# Patient Record
Sex: Female | Born: 1975 | Hispanic: Refuse to answer | Marital: Single | Smoking: Never smoker
Health system: Southern US, Community
[De-identification: ages and names within clinical notes are randomized; demographics above are authoritative.]

## PROBLEM LIST (undated history)

## (undated) DIAGNOSIS — E119 Type 2 diabetes mellitus without complications: Secondary | ICD-10-CM

## (undated) DIAGNOSIS — E282 Polycystic ovarian syndrome: Secondary | ICD-10-CM

## (undated) HISTORY — PX: APPENDECTOMY: SHX54

## (undated) HISTORY — DX: Polycystic ovarian syndrome: E28.2

## (undated) HISTORY — PX: SALPINGECTOMY: SHX328

## (undated) HISTORY — DX: Type 2 diabetes mellitus without complications: E11.9

## (undated) HISTORY — PX: OOPHORECTOMY: SHX86

---

## 1999-09-19 ENCOUNTER — Other Ambulatory Visit: Admission: RE | Admit: 1999-09-19 | Discharge: 1999-09-19 | Payer: Self-pay | Admitting: Obstetrics and Gynecology

## 2000-11-12 ENCOUNTER — Other Ambulatory Visit: Admission: RE | Admit: 2000-11-12 | Discharge: 2000-11-12 | Payer: Self-pay | Admitting: Obstetrics and Gynecology

## 2012-09-09 HISTORY — PX: TERATOMA EXCISION: SHX2491

## 2014-12-20 ENCOUNTER — Encounter: Payer: Self-pay | Admitting: *Deleted

## 2014-12-20 ENCOUNTER — Ambulatory Visit
Admit: 2014-12-20 | Disposition: A | Discharge status: Home / Self Care | Payer: Self-pay | Attending: Obstetrics and Gynecology | Admitting: Obstetrics and Gynecology

## 2017-10-15 ENCOUNTER — Encounter: Payer: Self-pay | Admitting: Obstetrics and Gynecology

## 2017-10-15 ENCOUNTER — Ambulatory Visit (INDEPENDENT_AMBULATORY_CARE_PROVIDER_SITE_OTHER): Payer: BC Managed Care – PPO | Admitting: Obstetrics and Gynecology

## 2017-10-15 DIAGNOSIS — Z569 Unspecified problems related to employment: Secondary | ICD-10-CM

## 2017-10-15 DIAGNOSIS — Z1231 Encounter for screening mammogram for malignant neoplasm of breast: Secondary | ICD-10-CM

## 2017-10-15 DIAGNOSIS — Z124 Encounter for screening for malignant neoplasm of cervix: Secondary | ICD-10-CM | POA: Diagnosis not present

## 2017-10-15 DIAGNOSIS — Z01419 Encounter for gynecological examination (general) (routine) without abnormal findings: Secondary | ICD-10-CM | POA: Diagnosis not present

## 2017-10-15 DIAGNOSIS — Z1239 Encounter for other screening for malignant neoplasm of breast: Secondary | ICD-10-CM

## 2017-10-15 DIAGNOSIS — Z113 Encounter for screening for infections with a predominantly sexual mode of transmission: Secondary | ICD-10-CM | POA: Diagnosis not present

## 2017-10-15 NOTE — Patient Instructions (Signed)
Preventive Care 18-39 Years, Female Preventive care refers to lifestyle choices and visits with your health care provider that can promote health and wellness. What does preventive care include?  A yearly physical exam. This is also called an annual well check.  Dental exams once or twice a year.  Routine eye exams. Ask your health care provider how often you should have your eyes checked.  Personal lifestyle choices, including: ? Daily care of your teeth and gums. ? Regular physical activity. ? Eating a healthy diet. ? Avoiding tobacco and drug use. ? Limiting alcohol use. ? Practicing safe sex. ? Taking vitamin and mineral supplements as recommended by your health care provider. What happens during an annual well check? The services and screenings done by your health care provider during your annual well check will depend on your age, overall health, lifestyle risk factors, and family history of disease. Counseling Your health care provider may ask you questions about your:  Alcohol use.  Tobacco use.  Drug use.  Emotional well-being.  Home and relationship well-being.  Sexual activity.  Eating habits.  Work and work Statistician.  Method of birth control.  Menstrual cycle.  Pregnancy history.  Screening You may have the following tests or measurements:  Height, weight, and BMI.  Diabetes screening. This is done by checking your blood sugar (glucose) after you have not eaten for a while (fasting).  Blood pressure.  Lipid and cholesterol levels. These may be checked every 5 years starting at age 66.  Skin check.  Hepatitis C blood test.  Hepatitis B blood test.  Sexually transmitted disease (STD) testing.  BRCA-related cancer screening. This may be done if you have a family history of breast, ovarian, tubal, or peritoneal cancers.  Pelvic exam and Pap test. This may be done every 3 years starting at age 40. Starting at age 59, this may be done every 5  years if you have a Pap test in combination with an HPV test.  Discuss your test results, treatment options, and if necessary, the need for more tests with your health care provider. Vaccines Your health care provider may recommend certain vaccines, such as:  Influenza vaccine. This is recommended every year.  Tetanus, diphtheria, and acellular pertussis (Tdap, Td) vaccine. You may need a Td booster every 10 years.  Varicella vaccine. You may need this if you have not been vaccinated.  HPV vaccine. If you are 69 or younger, you may need three doses over 6 months.  Measles, mumps, and rubella (MMR) vaccine. You may need at least one dose of MMR. You may also need a second dose.  Pneumococcal 13-valent conjugate (PCV13) vaccine. You may need this if you have certain conditions and were not previously vaccinated.  Pneumococcal polysaccharide (PPSV23) vaccine. You may need one or two doses if you smoke cigarettes or if you have certain conditions.  Meningococcal vaccine. One dose is recommended if you are age 27-21 years and a first-year college student living in a residence hall, or if you have one of several medical conditions. You may also need additional booster doses.  Hepatitis A vaccine. You may need this if you have certain conditions or if you travel or work in places where you may be exposed to hepatitis A.  Hepatitis B vaccine. You may need this if you have certain conditions or if you travel or work in places where you may be exposed to hepatitis B.  Haemophilus influenzae type b (Hib) vaccine. You may need this if  you have certain risk factors.  Talk to your health care provider about which screenings and vaccines you need and how often you need them. This information is not intended to replace advice given to you by your health care provider. Make sure you discuss any questions you have with your health care provider. Document Released: 10/22/2001 Document Revised: 05/15/2016  Document Reviewed: 06/27/2015 Elsevier Interactive Patient Education  [employer].

## 2017-10-15 NOTE — Progress Notes (Signed)
Patient ID: Tamara Blanchard, female   DOB: 01/21/1976, 42 y.o.   MRN: [id number]    Gynecology Annual Exam  PCP: Patient, No Pcp Per  Chief Complaint:  Chief Complaint  Patient presents with  . Gynecologic Exam    History of Present Illness: Patient is a 42 y.o. G1P1 presents for annual exam. The patient has no complaints today.   LMP: No LMP recorded. Patient is not currently having periods (Reason: IUD).  The patient is sexually active. She currently uses none for contraception. She denies dyspareunia.  The patient does perform self breast exams.  There is no notable family history of breast or ovarian cancer in her family.  The patient wears seatbelts: yes.   The patient has regular exercise: not asked.    The patient denies current symptoms of depression.    Review of Systems: Review of Systems  Constitutional: Negative for chills and fever.  HENT: Negative for congestion.   Respiratory: Negative for cough and shortness of breath.   Cardiovascular: Negative for chest pain and palpitations.  Gastrointestinal: Negative for abdominal pain, constipation, diarrhea, heartburn, nausea and vomiting.  Genitourinary: Negative for dysuria, frequency and urgency.  Skin: Negative for itching and rash.  Neurological: Negative for dizziness and headaches.  Endo/Heme/Allergies: Negative for polydipsia.  Psychiatric/Behavioral: Negative for depression.    Past Medical History:  Past Medical History:  Diagnosis Date  . Diabetes mellitus without complication (HCC)    Type 2  . PCOS (polycystic ovarian syndrome)     Past Surgical History:  Past Surgical History:  Procedure Laterality Date  . APPENDECTOMY    . OOPHORECTOMY     Left  . SALPINGECTOMY     Left   . TERATOMA EXCISION  2014   x3    Gynecologic History:  No LMP recorded. Patient is not currently having periods (Reason: IUD). Contraception: IUD Mirena 08/09/2014 Last Pap: Results were: 06/30/2013 NIL and HR HPV  negative  Last mammogram: 12/21/2014 Results were: BI-RAD II Obstetric History: G1P1  Family History:  Family History  Problem Relation Age of Onset  . Cervical cancer Mother [id number]       Partial hysterectomy    Social History:  Social History   Socioeconomic History  . Marital status: Unknown    Spouse name: Not on file  . Number of children: Not on file  . Years of education: Not on file  . Highest education level: Not on file  Social Needs  . Financial resource strain: Not on file  . Food insecurity - worry: Not on file  . Food insecurity - inability: Not on file  . Transportation needs - medical: Not on file  . Transportation needs - non-medical: Not on file  Occupational History  . Not on file  Tobacco Use  . Smoking status: Never Smoker  . Smokeless tobacco: Never Used  Substance and Sexual Activity  . Alcohol use: Yes  . Drug use: No  . Sexual activity: Not Currently    Birth control/protection: IUD  Other Topics Concern  . Not on file  Social History Narrative  . Not on file    Allergies:  No Known Allergies  Medications: Prior to Admission medications   Medication Sig Start Date End Date Taking? Authorizing Provider  calcium carbonate (CALCIUM 600) 600 MG TABS tablet Take 600 mg by mouth.   Yes [provider]  Cholecalciferol (VITAMIN D3) 1000 units CAPS Take by mouth.   Yes [provider]  metFORMIN (GLUCOPHAGE)  1000 MG tablet TAKE 1 TABLET (1,000 MG TOTAL) BY MOUTH 2 (TWO) TIMES A DAY WITH MEALS. 08/06/17  Yes [provider]  Multiple Vitamin (MULTI-VITAMINS) TABS Take by mouth.   Yes [provider]  TRULICITY 1.5 MG/0.5ML SOPN  10/06/17  Yes [provider]    Physical Exam Vitals: Blood pressure 138/80, pulse 91, height 5' 3.5" (1.613 m), weight 178 lb (80.7 kg).  General: NAD HEENT: normocephalic, anicteric Thyroid: no enlargement, no palpable nodules Pulmonary: No increased work of breathing,  CTAB Cardiovascular: RRR, distal pulses 2+ Breast: Breast symmetrical, no tenderness, no palpable nodules or masses, no skin or nipple retraction present, no nipple discharge.  No axillary or supraclavicular lymphadenopathy. Abdomen: NABS, soft, non-tender, non-distended.  Umbilicus without lesions.  No hepatomegaly, splenomegaly or masses palpable. No evidence of hernia  Genitourinary:  External: Normal external female genitalia.  Normal urethral meatus, normal Bartholin's and Skene's glands.    Vagina: Normal vaginal mucosa, no evidence of prolapse.    Cervix: Grossly normal in appearance, no bleeding, and IUD strings visualized  Uterus: Non-enlarged, mobile, normal contour.  No CMT  Adnexa: ovaries non-enlarged, no adnexal masses  Rectal: deferred  Lymphatic: no evidence of inguinal lymphadenopathy Extremities: no edema, erythema, or tenderness Neurologic: Grossly intact Psychiatric: mood appropriate, affect full  Female chaperone present for pelvic and breast  portions of the physical exam    Assessment: 42 y.o. G1P1 routine annual exam  Plan: Problem List Items Addressed This Visit    None    Visit Diagnoses    Screening for malignant neoplasm of cervix       Relevant Orders   PapIG, HPV, rfx 16/18   Breast screening       Relevant Orders   MM DIGITAL SCREENING BILATERAL   Encounter for gynecological examination without abnormal finding       Relevant Orders   PapIG, HPV, rfx 16/18   HEP, RPR, HIV Panel (Completed)   Hepatitis C antibody (Completed)   Routine screening for STI (sexually transmitted infection)       Relevant Orders   HEP, RPR, HIV Panel (Completed)   Hepatitis C antibody (Completed)   Adverse exposure in workplace       Relevant Orders   HEP, RPR, HIV Panel (Completed)   Hepatitis C antibody (Completed)      1) Mammogram - recommend yearly screening mammogram.  Mammogram Was ordered today   2) STI screening was offered and accepted  3) ASCCP  guidelines and rational discussed.  Patient opts for every 3 years screening interval  4) Contraception - continue IUD 08/09/2014  5) Routine healthcare maintenance including cholesterol, diabetes screening discussed managed by PCP

## 2017-10-16 LAB — HEP, RPR, HIV PANEL
HIV SCREEN 4TH GENERATION: NONREACTIVE
Hepatitis B Surface Ag: NEGATIVE
RPR Ser Ql: NONREACTIVE

## 2017-10-16 LAB — HEPATITIS C ANTIBODY: Hep C Virus Ab: <0.1 s/co ratio (ref 0.0–0.9)

## 2017-10-20 LAB — PAPIG, HPV, RFX 16/18
HPV GENOTYPE, 16: NEGATIVE
HPV Genotype, 18: NEGATIVE
HPV, HIGH-RISK: POSITIVE — AB
PAP Smear Comment: 0

## 2017-10-24 ENCOUNTER — Encounter: Payer: Self-pay | Admitting: Obstetrics and Gynecology

## 2017-10-24 DIAGNOSIS — R87810 Cervical high risk human papillomavirus (HPV) DNA test positive: Secondary | ICD-10-CM | POA: Insufficient documentation

## 2018-05-12 ENCOUNTER — Ambulatory Visit
Admission: RE | Admit: 2018-05-12 | Discharge: 2018-05-12 | Disposition: A | Discharge status: Home / Self Care | Payer: BC Managed Care – PPO | Source: Ambulatory Visit | Attending: Obstetrics and Gynecology | Admitting: Obstetrics and Gynecology

## 2018-05-12 DIAGNOSIS — Z1239 Encounter for other screening for malignant neoplasm of breast: Secondary | ICD-10-CM

## 2018-05-12 DIAGNOSIS — Z1231 Encounter for screening mammogram for malignant neoplasm of breast: Secondary | ICD-10-CM | POA: Diagnosis present

## 2018-05-15 ENCOUNTER — Other Ambulatory Visit: Payer: Self-pay | Admitting: *Deleted

## 2018-05-15 ENCOUNTER — Inpatient Hospital Stay
Admission: RE | Admit: 2018-05-15 | Discharge: 2018-05-15 | Disposition: A | Discharge status: Home / Self Care | Payer: Self-pay | Source: Ambulatory Visit | Attending: *Deleted | Admitting: *Deleted

## 2018-05-15 DIAGNOSIS — Z9289 Personal history of other medical treatment: Secondary | ICD-10-CM

## 2018-10-20 ENCOUNTER — Ambulatory Visit (INDEPENDENT_AMBULATORY_CARE_PROVIDER_SITE_OTHER): Payer: BC Managed Care – PPO | Admitting: Obstetrics and Gynecology

## 2018-10-20 ENCOUNTER — Encounter: Payer: Self-pay | Admitting: Obstetrics and Gynecology

## 2018-10-20 ENCOUNTER — Other Ambulatory Visit (HOSPITAL_COMMUNITY)
Admission: RE | Admit: 2018-10-20 | Discharge: 2018-10-20 | Disposition: A | Discharge status: Home / Self Care | Payer: BC Managed Care – PPO | Source: Ambulatory Visit | Attending: Obstetrics and Gynecology | Admitting: Obstetrics and Gynecology

## 2018-10-20 VITALS — BP 142/92 | HR 97 | Ht 63.5 in | Wt 177.0 lb

## 2018-10-20 DIAGNOSIS — Z124 Encounter for screening for malignant neoplasm of cervix: Secondary | ICD-10-CM

## 2018-10-20 DIAGNOSIS — Z01419 Encounter for gynecological examination (general) (routine) without abnormal findings: Secondary | ICD-10-CM | POA: Diagnosis not present

## 2018-10-20 DIAGNOSIS — Z1239 Encounter for other screening for malignant neoplasm of breast: Secondary | ICD-10-CM

## 2018-10-20 NOTE — Patient Instructions (Signed)
[facility] [address] [place] [address]  [facility] [place]  [address] [place] [address]  Phone: [phone]  

## 2018-10-20 NOTE — Progress Notes (Signed)
Gynecology Annual Exam  PCP: [facility], [place]  Chief Complaint:  Chief Complaint  Patient presents with  . Gynecologic Exam    History of Present Illness: Patient is a 43 y.o. G1P1 presents for annual exam. The patient has no complaints today.   LMP: No LMP recorded. (Menstrual status: IUD). Absent on IUD   The patient is sexually active. She currently uses IUD for contraception. She denies dyspareunia.  There is no notable family history of breast or ovarian cancer in her family.  The patient wears seatbelts: yes.   The patient has regular exercise: not asked.    The patient denies current symptoms of depression.    Review of Systems: Review of Systems  Constitutional: Negative for chills and fever.  HENT: Negative for congestion.   Respiratory: Negative for cough and shortness of breath.   Cardiovascular: Negative for chest pain and palpitations.  Gastrointestinal: Negative for abdominal pain, constipation, diarrhea, heartburn, nausea and vomiting.  Genitourinary: Negative for dysuria, frequency and urgency.  Skin: Negative for itching and rash.  Neurological: Negative for dizziness and headaches.  Endo/Heme/Allergies: Negative for polydipsia.  Psychiatric/Behavioral: Negative for depression.    Past Medical History:  Past Medical History:  Diagnosis Date  . Diabetes mellitus without complication (HCC)    Type 2  . PCOS (polycystic ovarian syndrome)     Past Surgical History:  Past Surgical History:  Procedure Laterality Date  . APPENDECTOMY    . OOPHORECTOMY     Left  . SALPINGECTOMY     Left   . TERATOMA EXCISION  2014   x3    Gynecologic History:  No LMP recorded. (Menstrual status: IUD). Contraception:08/10/2014 Mirena IUD Last Pap: Results were: 10/15/2017 NIL and HR HPV+  Last mammogram: 05/13/19-19 Results were: BI-RAD I  Obstetric History: G1P1  Family History:  Family History  Problem Relation Age of Onset  . Cervical cancer  Mother 53       Partial hysterectomy    Social History:  Social History   Socioeconomic History  . Marital status: Single    Spouse name: Not on file  . Number of children: Not on file  . Years of education: Not on file  . Highest education level: Not on file  Occupational History  . Not on file  Social Needs  . Financial resource strain: Not on file  . Food insecurity:    Worry: Not on file    Inability: Not on file  . Transportation needs:    Medical: Not on file    Non-medical: Not on file  Tobacco Use  . Smoking status: Never Smoker  . Smokeless tobacco: Never Used  Substance and Sexual Activity  . Alcohol use: Yes  . Drug use: No  . Sexual activity: Not Currently    Birth control/protection: I.U.D.  Lifestyle  . Physical activity:    Days per week: 0 days    Minutes per session: 0 min  . Stress: Not at all  Relationships  . Social connections:    Talks on phone: More than three times a week    Gets together: Never    Attends religious service: 1 to 4 times per year    Active member of club or organization: Yes    Attends meetings of clubs or organizations: Never    Relationship status: Never married  . Intimate partner violence:    Fear of current or ex partner: No    Emotionally abused: No  Physically abused: No    Forced sexual activity: No  Other Topics Concern  . Not on file  Social History Narrative  . Not on file    Allergies:  No Known Allergies  Medications: Prior to Admission medications   Medication Sig Start Date End Date Taking? Authorizing Provider  calcium carbonate (CALCIUM 600) 600 MG TABS tablet Take 600 mg by mouth.   Yes [provider]  Cholecalciferol (VITAMIN D3) 1000 units CAPS Take by mouth.   Yes [provider]  glucose blood (PRECISION QID TEST) test strip Twice daily, ICD9 250.0 07/04/14  Yes [provider]  metFORMIN (GLUCOPHAGE) 1000 MG tablet TAKE 1 TABLET (1,000 MG TOTAL) BY MOUTH 2  (TWO) TIMES A DAY WITH MEALS. 08/06/17  Yes [provider]  Multiple Vitamin (MULTI-VITAMINS) TABS Take by mouth.   Yes [provider]  TRULICITY 1.5 MG/0.5ML SOPN  10/06/17  Yes [provider]    Physical Exam Vitals: Blood pressure (!) 142/92, pulse 97, height 5' 3.5" (1.613 m), weight 177 lb (80.3 kg).  General: NAD HEENT: normocephalic, anicteric Thyroid: no enlargement, no palpable nodules Pulmonary: No increased work of breathing, CTAB Cardiovascular: RRR, distal pulses 2+ Breast: Breast symmetrical, no tenderness, no palpable nodules or masses, no skin or nipple retraction present, no nipple discharge.  No axillary or supraclavicular lymphadenopathy. Abdomen: NABS, soft, non-tender, non-distended.  Umbilicus without lesions.  No hepatomegaly, splenomegaly or masses palpable. No evidence of hernia  Genitourinary:  External: Normal external female genitalia.  Normal urethral meatus, normal Bartholin's and Skene's glands.    Vagina: Normal vaginal mucosa, no evidence of prolapse.    Cervix: Grossly normal in appearance, no bleeding, IUD strings visualized 2cm  Uterus: Non-enlarged, mobile, normal contour.  No CMT  Adnexa: ovaries non-enlarged, no adnexal masses  Rectal: deferred  Lymphatic: no evidence of inguinal lymphadenopathy Extremities: no edema, erythema, or tenderness Neurologic: Grossly intact Psychiatric: mood appropriate, affect full  Female chaperone present for pelvic and breast  portions of the physical exam    Assessment: 43 y.o. G1P1 routine annual exam  Plan: Problem List Items Addressed This Visit    None    Visit Diagnoses    Encounter for gynecological examination without abnormal finding    -  Primary   Screening for malignant neoplasm of cervix       Relevant Orders   Cytology, thin prep pap (cervical)   Breast screening       Relevant Orders   MM 3D SCREEN BREAST BILATERAL      1) Mammogram - recommend yearly  screening mammogram.  Mammogram Is up to date   2) STI screening  was notoffered and therefore not obtained  3) ASCCP guidelines and rational discussed.  Patient opts for yearly screening interval  4) Contraception - the patient is currently using  IUD.  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   Vena Austria, MD, [facility], [facility] 10/20/2018, 9:10 AM

## 2018-10-21 LAB — CYTOLOGY - PAP
Diagnosis: NEGATIVE
HPV: NOT DETECTED

## 2019-09-23 ENCOUNTER — Ambulatory Visit
Admission: RE | Admit: 2019-09-23 | Discharge: 2019-09-23 | Disposition: A | Discharge status: Home / Self Care | Payer: BC Managed Care – PPO | Source: Ambulatory Visit | Attending: Obstetrics and Gynecology | Admitting: Obstetrics and Gynecology

## 2019-09-23 DIAGNOSIS — Z1239 Encounter for other screening for malignant neoplasm of breast: Secondary | ICD-10-CM

## 2019-09-23 DIAGNOSIS — Z1231 Encounter for screening mammogram for malignant neoplasm of breast: Secondary | ICD-10-CM | POA: Insufficient documentation

## 2019-10-25 ENCOUNTER — Ambulatory Visit: Payer: BC Managed Care – PPO | Admitting: Obstetrics and Gynecology

## 2019-10-29 ENCOUNTER — Ambulatory Visit (INDEPENDENT_AMBULATORY_CARE_PROVIDER_SITE_OTHER): Payer: BC Managed Care – PPO | Admitting: Obstetrics and Gynecology

## 2019-10-29 ENCOUNTER — Encounter: Payer: Self-pay | Admitting: Obstetrics and Gynecology

## 2019-10-29 ENCOUNTER — Other Ambulatory Visit: Payer: Self-pay

## 2019-10-29 VITALS — BP 126/84 | HR 89 | Ht 63.5 in | Wt 174.0 lb

## 2019-10-29 DIAGNOSIS — Z1239 Encounter for other screening for malignant neoplasm of breast: Secondary | ICD-10-CM

## 2019-10-29 DIAGNOSIS — Z01419 Encounter for gynecological examination (general) (routine) without abnormal findings: Secondary | ICD-10-CM

## 2019-10-29 DIAGNOSIS — Z30431 Encounter for routine checking of intrauterine contraceptive device: Secondary | ICD-10-CM

## 2019-10-29 NOTE — Progress Notes (Signed)
Gynecology Annual Exam  PCP: [facility], [place]  Chief Complaint:  Chief Complaint  Patient presents with  . Gynecologic Exam    History of Present Illness: Patient is a 44 y.o. G1P1 presents for annual exam. The patient has no complaints today.   LMP: No LMP recorded. (Menstrual status: IUD). Amenorrhea on IUD  The patient is sexually active. She currently uses IUD for contraception. She denies dyspareunia.  The patient does perform self breast exams.  There is no notable family history of breast or ovarian cancer in her family.  The patient wears seatbelts: yes.   The patient has regular exercise: not asked.    The patient denies current symptoms of depression.    Review of Systems: Review of Systems  Constitutional: Negative for chills and fever.  HENT: Negative for congestion.   Respiratory: Negative for cough and shortness of breath.   Cardiovascular: Negative for chest pain and palpitations.  Gastrointestinal: Negative for abdominal pain, constipation, diarrhea, heartburn, nausea and vomiting.  Genitourinary: Negative for dysuria, frequency and urgency.  Skin: Negative for itching and rash.  Neurological: Negative for dizziness and headaches.  Endo/Heme/Allergies: Negative for polydipsia.  Psychiatric/Behavioral: Negative for depression.    Past Medical History:  Past Medical History:  Diagnosis Date  . Diabetes mellitus without complication (HCC)    Type 2  . PCOS (polycystic ovarian syndrome)     Past Surgical History:  Past Surgical History:  Procedure Laterality Date  . APPENDECTOMY    . OOPHORECTOMY     Left  . SALPINGECTOMY     Left   . TERATOMA EXCISION  2014   x3    Gynecologic History:  No LMP recorded. (Menstrual status: IUD). Contraception:08/10/2014 Mirena IUD Last Pap: Results were: 10/20/2018 NIL and HR HPV negative  Last mammogram: 09/23/19 Results were: BI-RAD I  Obstetric History: G1P1  Family History:  Family History    Problem Relation Age of Onset  . Cervical cancer Mother 51       Partial hysterectomy    Social History:  Social History   Socioeconomic History  . Marital status: Single    Spouse name: Not on file  . Number of children: Not on file  . Years of education: Not on file  . Highest education level: Not on file  Occupational History  . Not on file  Tobacco Use  . Smoking status: Never Smoker  . Smokeless tobacco: Never Used  Substance and Sexual Activity  . Alcohol use: Yes  . Drug use: No  . Sexual activity: Not Currently    Birth control/protection: I.U.D.  Other Topics Concern  . Not on file  Social History Narrative  . Not on file   Social Determinants of Health   Financial Resource Strain:   . Difficulty of Paying Living Expenses: Not on file  Food Insecurity:   . Worried About Charity fundraiser in the Last Year: Not on file  . Ran Out of Food in the Last Year: Not on file  Transportation Needs:   . Lack of Transportation (Medical): Not on file  . Lack of Transportation (Non-Medical): Not on file  Physical Activity:   . Days of Exercise per Week: Not on file  . Minutes of Exercise per Session: Not on file  Stress:   . Feeling of Stress : Not on file  Social Connections:   . Frequency of Communication with Friends and Family: Not on file  . Frequency of Social Gatherings  with Friends and Family: Not on file  . Attends Religious Services: Not on file  . Active Member of Clubs or Organizations: Not on file  . Attends Banker Meetings: Not on file  . Marital Status: Not on file  Intimate Partner Violence:   . Fear of Current or Ex-Partner: Not on file  . Emotionally Abused: Not on file  . Physically Abused: Not on file  . Sexually Abused: Not on file    Allergies:  No Known Allergies  Medications: Prior to Admission medications   Medication Sig Start Date End Date Taking? Authorizing Provider  calcium carbonate (CALCIUM 600) 600 MG TABS  tablet Take 600 mg by mouth.   Yes [provider]  Cholecalciferol (VITAMIN D3) 1000 units CAPS Take by mouth.   Yes [provider]  metFORMIN (GLUCOPHAGE) 1000 MG tablet TAKE 1 TABLET (1,000 MG TOTAL) BY MOUTH 2 (TWO) TIMES A DAY WITH MEALS. 08/06/17  Yes [provider]  Multiple Vitamin (MULTI-VITAMINS) TABS Take by mouth.   Yes [provider]  TRULICITY 1.5 MG/0.5ML SOPN  10/06/17  Yes [provider]  glucose blood (PRECISION QID TEST) test strip Twice daily, ICD9 250.0 07/04/14   [provider]    Physical Exam Vitals: Blood pressure 126/84, pulse 89, height 5' 3.5" (1.613 m), weight 174 lb (78.9 kg).  General: NAD HEENT: normocephalic, anicteric Thyroid: no enlargement, no palpable nodules Pulmonary: No increased work of breathing, CTAB Cardiovascular: RRR, distal pulses 2+ Breast: Breast symmetrical, no tenderness, no palpable nodules or masses, no skin or nipple retraction present, no nipple discharge.  No axillary or supraclavicular lymphadenopathy. Abdomen: NABS, soft, non-tender, non-distended.  Umbilicus without lesions.  No hepatomegaly, splenomegaly or masses palpable. No evidence of hernia  Genitourinary:  External: Normal external female genitalia.  Normal urethral meatus, normal Bartholin's and Skene's glands.    Vagina: Normal vaginal mucosa, no evidence of prolapse.    Cervix: Grossly normal in appearance, no bleeding, IUD strings visualized  Uterus: Non-enlarged, mobile, normal contour.  No CMT  Adnexa: ovaries non-enlarged, no adnexal masses  Rectal: deferred  Lymphatic: no evidence of inguinal lymphadenopathy Extremities: no edema, erythema, or tenderness Neurologic: Grossly intact Psychiatric: mood appropriate, affect full  Female chaperone present for pelvic and breast  portions of the physical exam     Assessment: 44 y.o. G1P1 routine annual exam  Plan: Problem List Items Addressed This Visit     None      1) Mammogram - recommend yearly screening mammogram.  Mammogram Is up to date   2) STI screening  was notoffered and therefore not obtained  3) ASCCP guidelines and rational discussed.  Patient opts for every 3 years screening interval  4) Contraception - the patient is currently using  IUD.  She is happy with her current form of contraception and plans to continue  5) Colonoscopy -- Screening recommended starting at age 35 for average risk individuals, age 62 for individuals deemed at increased risk (including African Americans) and recommended to continue until age 16.  For patient age 55-85 individualized approach is recommended.  Gold standard screening is via colonoscopy, Cologuard screening is an acceptable alternative for patient unwilling or unable to undergo colonoscopy.  "Colorectal cancer screening for average?risk adults: 2018 guideline update from the American Cancer Society"CA: A Cancer Journal for Clinicians: Feb 05, 2017   6) Routine healthcare maintenance including cholesterol, diabetes screening discussed managed by PCP  7) Return in about 1 year (around 10/28/2020)  for annual.   Vena Austria, MD, [facility], [facility] 10/29/2019, 9:28 AM

## 2020-09-28 ENCOUNTER — Other Ambulatory Visit: Payer: Self-pay | Admitting: Obstetrics and Gynecology

## 2020-09-28 DIAGNOSIS — Z1231 Encounter for screening mammogram for malignant neoplasm of breast: Secondary | ICD-10-CM

## 2020-10-26 ENCOUNTER — Ambulatory Visit
Admission: RE | Admit: 2020-10-26 | Discharge: 2020-10-26 | Disposition: A | Discharge status: Home / Self Care | Payer: BC Managed Care – PPO | Source: Ambulatory Visit | Attending: Obstetrics and Gynecology | Admitting: Obstetrics and Gynecology

## 2020-10-26 ENCOUNTER — Other Ambulatory Visit: Payer: Self-pay

## 2020-10-26 DIAGNOSIS — Z1231 Encounter for screening mammogram for malignant neoplasm of breast: Secondary | ICD-10-CM | POA: Insufficient documentation

## 2020-10-27 ENCOUNTER — Other Ambulatory Visit: Payer: BC Managed Care – PPO

## 2020-10-27 DIAGNOSIS — Z20822 Contact with and (suspected) exposure to covid-19: Secondary | ICD-10-CM

## 2020-10-28 LAB — NOVEL CORONAVIRUS, NAA: SARS-CoV-2, NAA: NOT DETECTED

## 2020-10-28 LAB — SARS-COV-2, NAA 2 DAY TAT

## 2020-10-30 ENCOUNTER — Other Ambulatory Visit: Payer: Self-pay

## 2020-10-30 ENCOUNTER — Ambulatory Visit (INDEPENDENT_AMBULATORY_CARE_PROVIDER_SITE_OTHER): Payer: BC Managed Care – PPO | Admitting: Obstetrics and Gynecology

## 2020-10-30 ENCOUNTER — Encounter: Payer: Self-pay | Admitting: Obstetrics and Gynecology

## 2020-10-30 VITALS — BP 126/82 | Ht 63.5 in | Wt 171.2 lb

## 2020-10-30 DIAGNOSIS — Z01419 Encounter for gynecological examination (general) (routine) without abnormal findings: Secondary | ICD-10-CM | POA: Diagnosis not present

## 2020-10-30 DIAGNOSIS — Z1239 Encounter for other screening for malignant neoplasm of breast: Secondary | ICD-10-CM

## 2020-10-30 NOTE — Progress Notes (Signed)
Gynecology Annual Exam  PCP: [facility], [place]  Chief Complaint:  Chief Complaint  Patient presents with  . Gynecologic Exam    Annual - no concerns. RM 4    History of Present Illness: Patient is a 45 y.o. G1P1 presents for annual exam. The patient has no complaints today.   LMP: No LMP recorded. (Menstrual status: IUD). Amenorrhea secondary to Mirena IUD  The patient is not currently sexually active. She currently uses IUD for contraception. The patient does perform self breast exams.  There is no notable family history of breast or ovarian cancer in her family.  The patient wears seatbelts: yes.   The patient has regular exercise: not asked.    The patient denies current symptoms of depression.    Review of Systems: Review of Systems  Constitutional: Negative for chills and fever.  HENT: Negative for congestion.   Respiratory: Negative for cough and shortness of breath.   Cardiovascular: Negative for chest pain and palpitations.  Gastrointestinal: Negative for abdominal pain, constipation, diarrhea, heartburn, nausea and vomiting.  Genitourinary: Negative for dysuria, frequency and urgency.  Skin: Negative for itching and rash.  Neurological: Negative for dizziness and headaches.  Endo/Heme/Allergies: Negative for polydipsia.  Psychiatric/Behavioral: Negative for depression.    Past Medical History:  Patient Active Problem List   Diagnosis Date Noted  . Pap smear of cervix shows high risk HPV present 10/24/2017    10/15/2017 NILM and HPV 46/18 negative - repeat pap with co-testing in 1 year     Past Surgical History:  Past Surgical History:  Procedure Laterality Date  . APPENDECTOMY    . OOPHORECTOMY     Left  . SALPINGECTOMY     Left   . TERATOMA EXCISION  2014   x3    Gynecologic History:  No LMP recorded. (Menstrual status: IUD). Contraception:08/10/2014 Mirena IUD Last Pap: Results were: 10/20/2018  NIL and HR HPV negative  Last mammogram:  10/26/2020 Results pending  Obstetric History: G1P1  Family History:  Family History  Problem Relation Age of Onset  . Cervical cancer Mother 46       Partial hysterectomy  . Hypertension Mother   . Diabetes Father   . Hyperlipidemia Father   . Stroke Father   . Heart failure Father   . Cancer Maternal Grandmother   . Cancer Maternal Grandfather     Social History:  Social History   Socioeconomic History  . Marital status: Single    Spouse name: Not on file  . Number of children: Not on file  . Years of education: Not on file  . Highest education level: Not on file  Occupational History  . Not on file  Tobacco Use  . Smoking status: Never Smoker  . Smokeless tobacco: Never Used  Vaping Use  . Vaping Use: Never used  Substance and Sexual Activity  . Alcohol use: Yes  . Drug use: No  . Sexual activity: Not Currently    Birth control/protection: I.U.D.  Other Topics Concern  . Not on file  Social History Narrative  . Not on file   Social Determinants of Health   Financial Resource Strain: Not on file  Food Insecurity: Not on file  Transportation Needs: Not on file  Physical Activity: Not on file  Stress: Not on file  Social Connections: Not on file  Intimate Partner Violence: Not on file    Allergies:  No Known Allergies  Medications: Prior to Admission medications  Medication Sig Start Date End Date Taking? Authorizing Provider  atorvastatin (LIPITOR) 20 MG tablet Take 20 mg by mouth daily. 10/21/20  Yes [provider]  calcium carbonate (OS-CAL) 600 MG TABS tablet Take 600 mg by mouth.   Yes [provider]  Cholecalciferol (VITAMIN D3) 1000 units CAPS Take by mouth.   Yes [provider]  Dulaglutide (TRULICITY) 3 MG/0.5ML SOPN Inject into the skin. 10/19/20  Yes [provider]  metFORMIN (GLUCOPHAGE) 1000 MG tablet TAKE 1 TABLET (1,000 MG TOTAL) BY MOUTH 2 (TWO) TIMES A DAY WITH MEALS. 08/06/17  Yes [provider]  Multiple Vitamin (MULTI-VITAMINS) TABS Take by mouth.   Yes [provider]  glucose blood (PRECISION QID TEST) test strip Twice daily, ICD9 250.0 07/04/14   [provider]    Physical Exam Vitals: Blood pressure 126/82, height 5' 3.5" (1.613 m), weight 171 lb 4 oz (77.7 kg).  General: NAD HEENT: normocephalic, anicteric Thyroid: no enlargement, no palpable nodules Pulmonary: No increased work of breathing, CTAB Cardiovascular: RRR, distal pulses 2+ Breast: Breast symmetrical, no tenderness, no palpable nodules or masses, no skin or nipple retraction present, no nipple discharge.  No axillary or supraclavicular lymphadenopathy. Abdomen: NABS, soft, non-tender, non-distended.  Umbilicus without lesions.  No hepatomegaly, splenomegaly or masses palpable. No evidence of hernia  Genitourinary:  External: Normal external female genitalia.  Normal urethral meatus, normal Bartholin's and Skene's glands.    Vagina: Normal vaginal mucosa, no evidence of prolapse.    Cervix: Grossly normal in appearance, no bleeding, IUD stings visualized  Uterus: Non-enlarged, mobile, normal contour.  No CMT  Adnexa: ovaries non-enlarged, no adnexal masses  Rectal: deferred  Lymphatic: no evidence of inguinal lymphadenopathy Extremities: no edema, erythema, or tenderness Neurologic: Grossly intact Psychiatric: mood appropriate, affect full  Female chaperone present for pelvic and breast  portions of the physical exam    Assessment: 45 y.o. G1P1 routine annual exam  Plan: Problem List Items Addressed This Visit   None   Visit Diagnoses    Encounter for gynecological examination without abnormal finding    -  Primary   Breast screening          1) Mammogram - recommend yearly screening mammogram.  Mammogram Is up to date - results pending  2) STI screening  was notoffered and therefore not obtained  3) ASCCP guidelines and rational discussed.  Patient opts for  every 3 years screening interval  4) Contraception - the patient is currently using  IUD.  She is happy with her current form of contraception and plans to continue  5) Colonoscopy -- Screening recommended starting at age 46 for average risk individuals, age 34 for individuals deemed at increased risk (including African Americans) and recommended to continue until age 79.  For patient age 15-85 individualized approach is recommended.  Gold standard screening is via colonoscopy, Cologuard screening is an acceptable alternative for patient unwilling or unable to undergo colonoscopy.  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   Vena Austria, MD, [facility], [facility] 10/30/2020, 8:36 AM

## 2021-09-19 ENCOUNTER — Other Ambulatory Visit: Payer: Self-pay | Admitting: Obstetrics and Gynecology

## 2021-09-19 ENCOUNTER — Telehealth: Payer: Self-pay | Admitting: Advanced Practice Midwife

## 2021-09-19 DIAGNOSIS — Z1231 Encounter for screening mammogram for malignant neoplasm of breast: Secondary | ICD-10-CM

## 2021-09-19 NOTE — Telephone Encounter (Signed)
Patient coming in on 11/01/21 at 8:35 with [facility] for mirena removal/reinsertion

## 2021-09-21 NOTE — Telephone Encounter (Signed)
Noted. Will order to arrive by apt date/time. 

## 2021-10-29 ENCOUNTER — Other Ambulatory Visit: Payer: Self-pay

## 2021-10-29 ENCOUNTER — Ambulatory Visit
Admission: RE | Admit: 2021-10-29 | Discharge: 2021-10-29 | Disposition: A | Discharge status: Home / Self Care | Payer: BC Managed Care – PPO | Source: Ambulatory Visit | Attending: Obstetrics and Gynecology | Admitting: Obstetrics and Gynecology

## 2021-10-29 DIAGNOSIS — Z1231 Encounter for screening mammogram for malignant neoplasm of breast: Secondary | ICD-10-CM | POA: Diagnosis present

## 2021-11-01 ENCOUNTER — Ambulatory Visit: Payer: BC Managed Care – PPO | Admitting: Advanced Practice Midwife

## 2021-11-13 ENCOUNTER — Other Ambulatory Visit: Payer: Self-pay | Admitting: Obstetrics and Gynecology

## 2021-11-13 DIAGNOSIS — R928 Other abnormal and inconclusive findings on diagnostic imaging of breast: Secondary | ICD-10-CM

## 2021-11-13 NOTE — Progress Notes (Signed)
Dx mammo order for addl views. AMS pt but he needs someone from [facility] to place order. I notified Jamie at [facility] that order placed for pt.  ?

## 2021-12-03 ENCOUNTER — Other Ambulatory Visit: Payer: Self-pay

## 2021-12-03 ENCOUNTER — Encounter: Payer: Self-pay | Admitting: Obstetrics and Gynecology

## 2021-12-03 ENCOUNTER — Ambulatory Visit
Admission: RE | Admit: 2021-12-03 | Discharge: 2021-12-03 | Disposition: A | Discharge status: Home / Self Care | Payer: BC Managed Care – PPO | Source: Ambulatory Visit | Attending: Obstetrics and Gynecology | Admitting: Obstetrics and Gynecology

## 2021-12-03 DIAGNOSIS — R928 Other abnormal and inconclusive findings on diagnostic imaging of breast: Secondary | ICD-10-CM | POA: Insufficient documentation

## 2021-12-03 NOTE — Addendum Note (Signed)
Addended by: Althea Grimmer B on: 12/03/2021 01:54 PM ? ? Modules accepted: Orders ? ?

## 2022-05-21 ENCOUNTER — Ambulatory Visit
Admission: RE | Admit: 2022-05-21 | Discharge: 2022-05-21 | Disposition: A | Discharge status: Home / Self Care | Payer: BC Managed Care – PPO | Source: Ambulatory Visit | Attending: Obstetrics and Gynecology | Admitting: Obstetrics and Gynecology

## 2022-05-21 ENCOUNTER — Encounter: Payer: Self-pay | Admitting: Obstetrics and Gynecology

## 2022-05-21 DIAGNOSIS — R928 Other abnormal and inconclusive findings on diagnostic imaging of breast: Secondary | ICD-10-CM | POA: Insufficient documentation

## 2022-10-10 ENCOUNTER — Encounter: Payer: Self-pay | Admitting: Obstetrics and Gynecology

## 2022-10-18 ENCOUNTER — Other Ambulatory Visit: Payer: Self-pay | Admitting: Obstetrics and Gynecology

## 2022-10-18 DIAGNOSIS — R921 Mammographic calcification found on diagnostic imaging of breast: Secondary | ICD-10-CM

## 2022-10-31 ENCOUNTER — Ambulatory Visit
Admission: RE | Admit: 2022-10-31 | Discharge: 2022-10-31 | Disposition: A | Discharge status: Home / Self Care | Payer: BC Managed Care – PPO | Source: Ambulatory Visit | Attending: Obstetrics and Gynecology | Admitting: Obstetrics and Gynecology

## 2022-10-31 DIAGNOSIS — R921 Mammographic calcification found on diagnostic imaging of breast: Secondary | ICD-10-CM

## 2023-07-11 IMAGING — MG MM DIGITAL DIAGNOSTIC UNILAT*L* W/ TOMO W/ CAD
8 of 10 series · 8 of 22 positions shown · non-contrast
Comparison: Previous exam(s).

CLINICAL DATA: Callback for LEFT breast calcifications.

EXAM:
DIGITAL DIAGNOSTIC UNILATERAL LEFT MAMMOGRAM WITH TOMOSYNTHESIS AND
CAD
TECHNIQUE: Left digital diagnostic mammography and breast tomosynthesis was
performed. The images were evaluated with computer-aided detection.

[L ML (1 of 3)]
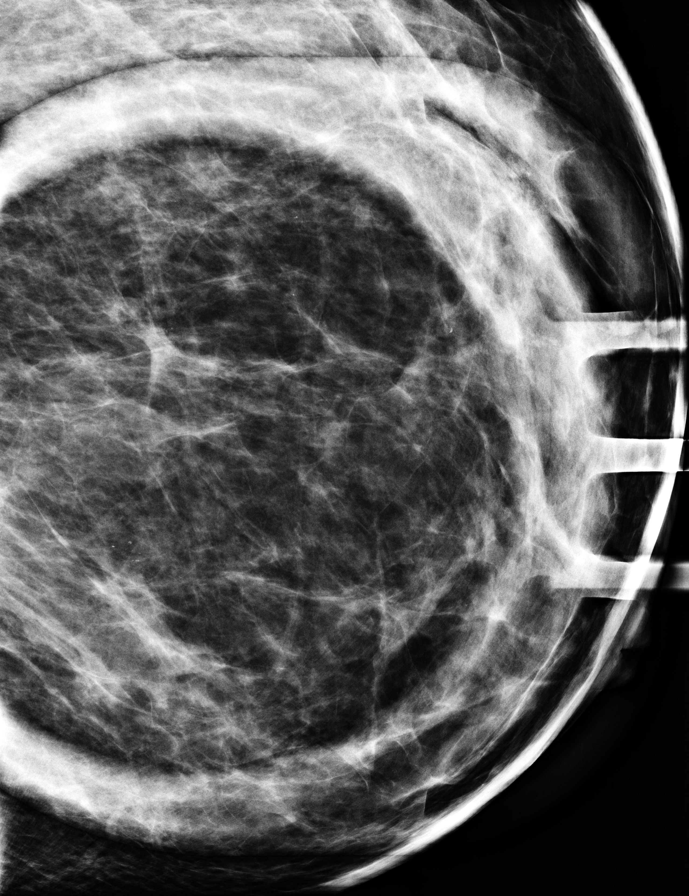

[L CC]
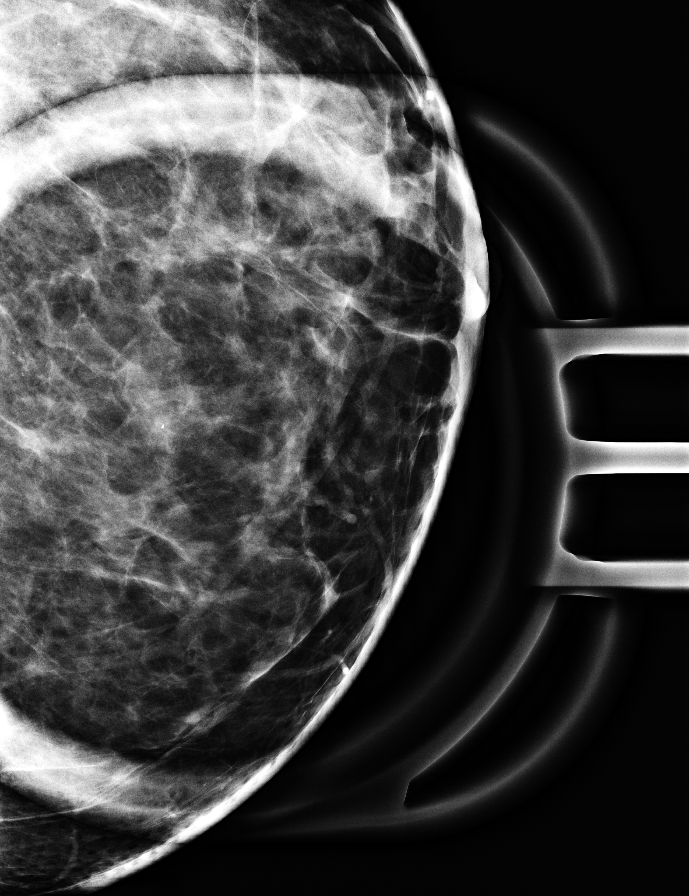

[L ML (2 of 3)]
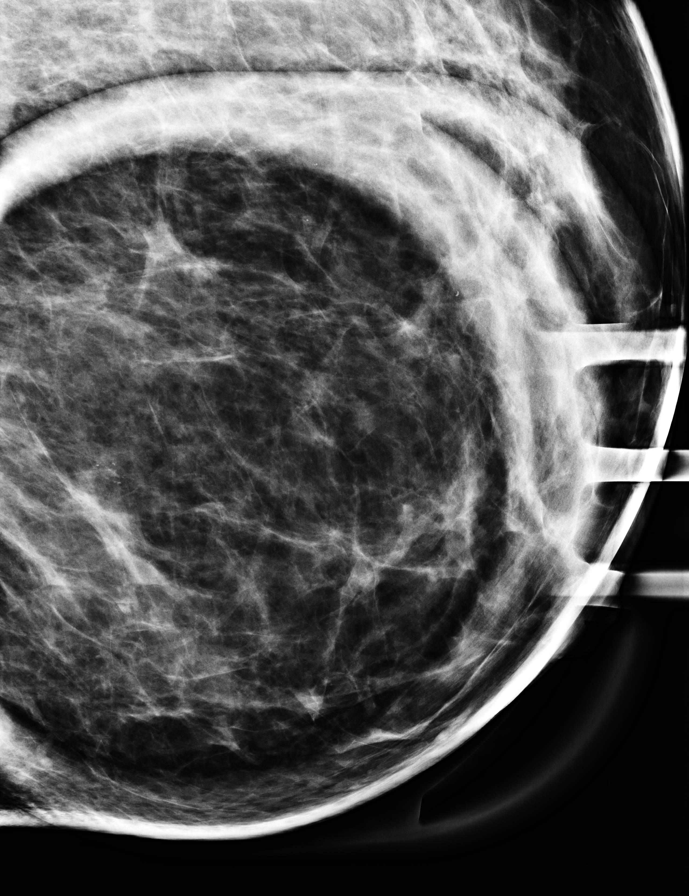

[L ML (3 of 3)]
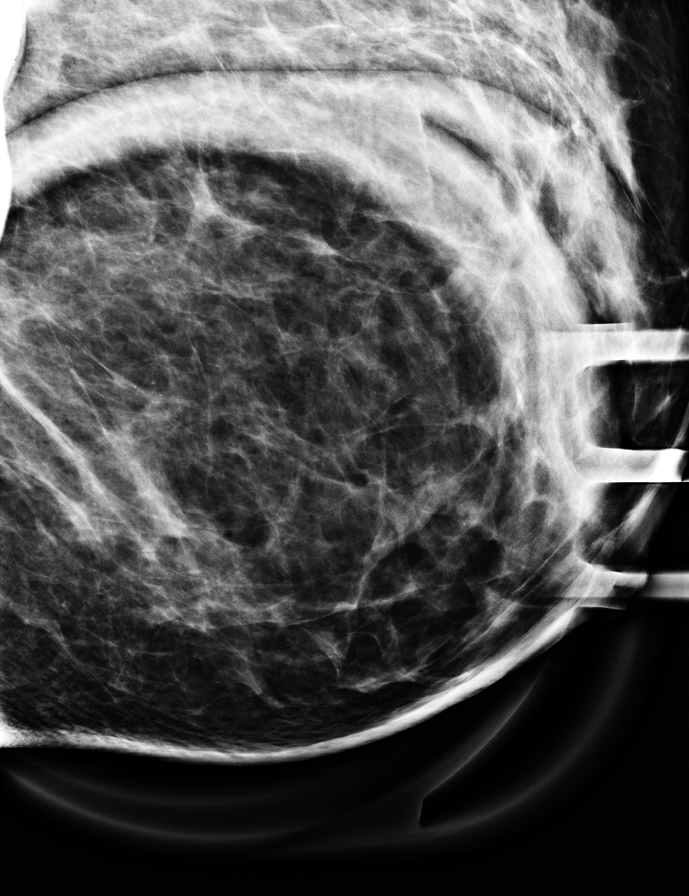

[L ML synth-2D]
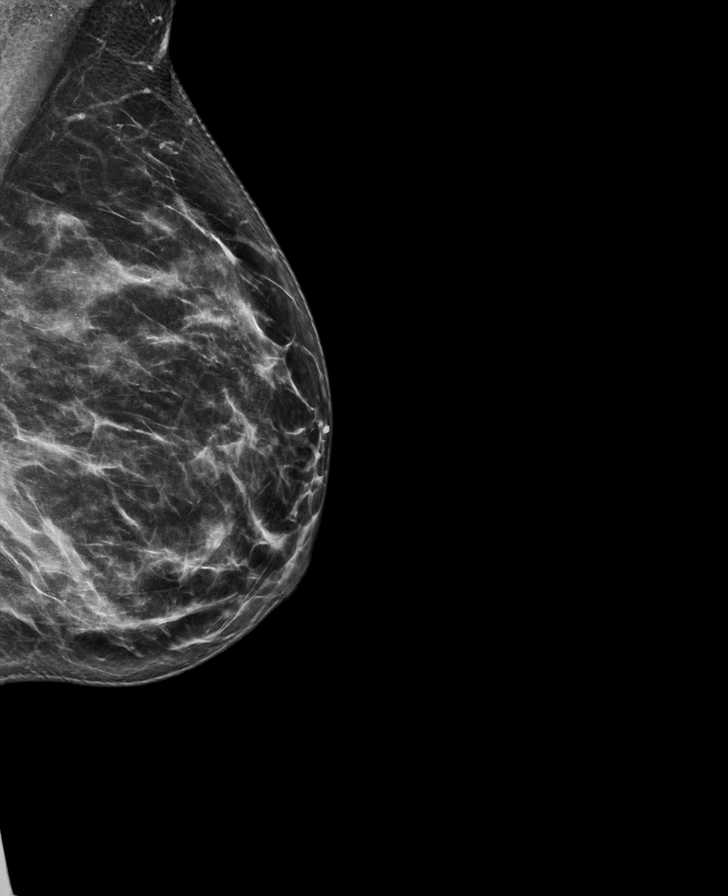

[L CC synth-2D]
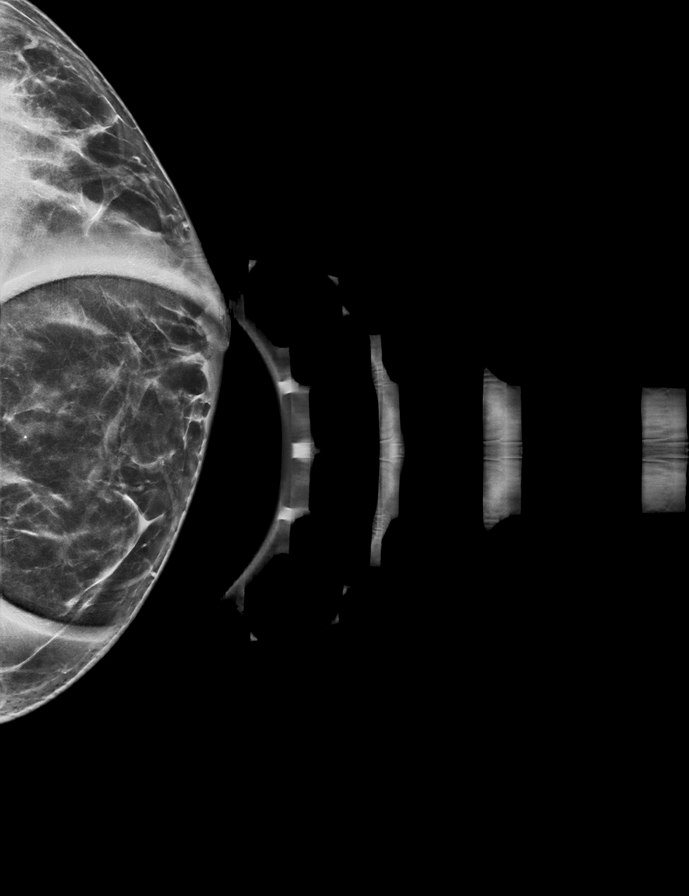

[L MLO synth-2D]
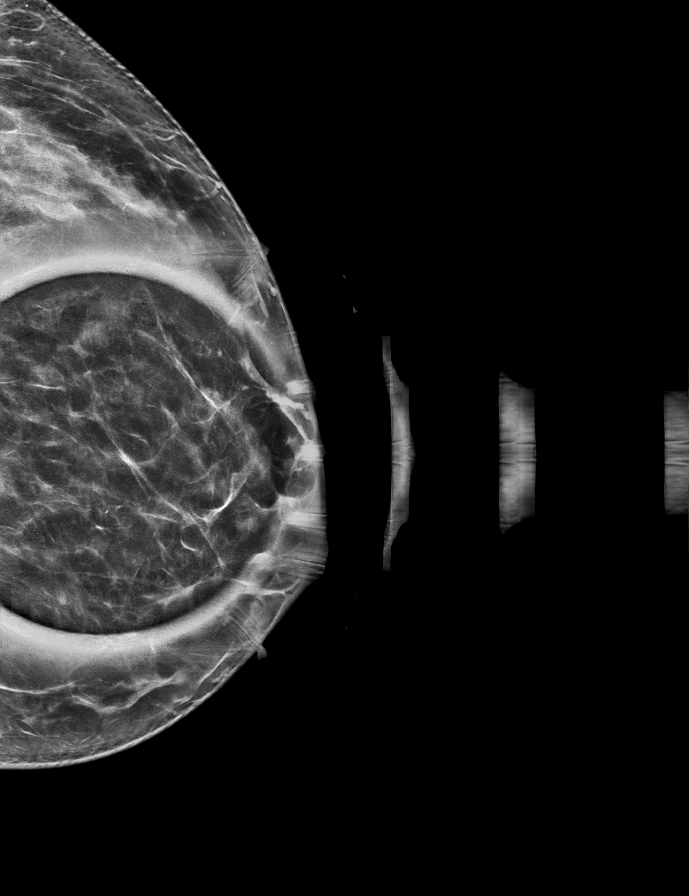

[L MLO tomo · tomo slice 39/77.0]
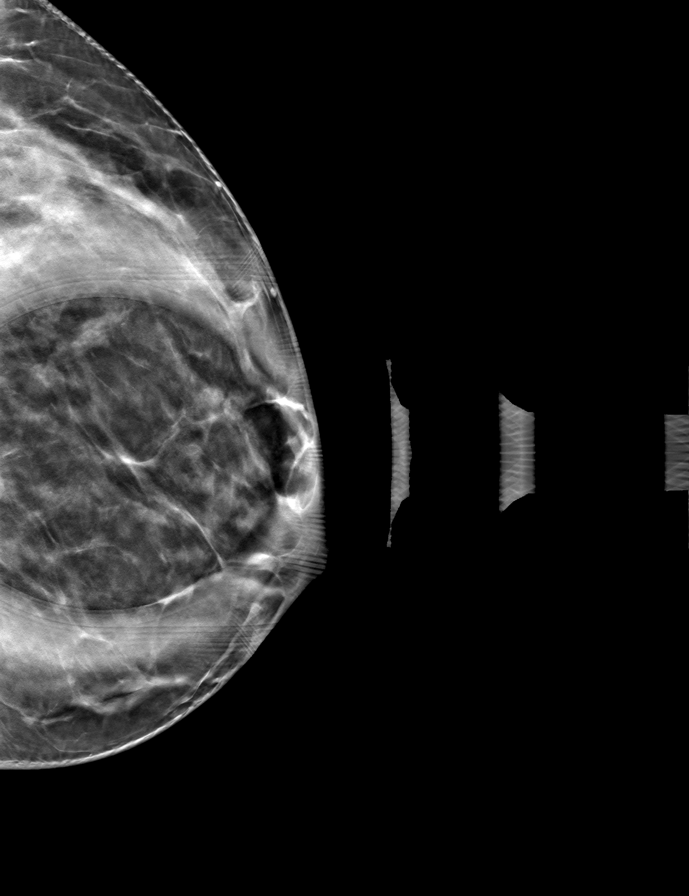

[8 of 22 positions shown; findings below may reference images not displayed]

ACR Breast Density Category b: There are scattered areas of
fibroglandular density.
FINDINGS: Spot magnification views of the LEFT breast demonstrate a 5 mm group
of calcifications in the LEFT lower slightly inner breast at middle
to posterior depth. The majority of these calcifications demonstrate
layering morphology on true lateral imaging spot magnification view
3. However, several of these calcifications do not definitively
demonstrate layering morphology.

Spot magnification views of the LEFT breast demonstrate a single
benign dystrophic calcification in the LEFT upper outer breast at
anterior depth at the second site of screening mammographic concern.

No additional suspicious findings noted.
IMPRESSION: There is a probably benign 5 mm group of calcifications in the LEFT
lower inner breast at middle to posterior depth. These likely
reflect benign milk of calcium given the majority of calcifications
demonstrate layering morphology. As such, recommend follow-up
diagnostic mammogram in 6 months. This will establish 6 months of
definitive stability.

RECOMMENDATION:
LEFT diagnostic mammogram in 6 months.

I have discussed the findings and recommendations with the patient.
If applicable, a reminder letter will be sent to the patient
regarding the next appointment.

BI-RADS CATEGORY  3: Probably benign.

## 2023-10-01 ENCOUNTER — Encounter: Payer: Self-pay | Admitting: Obstetrics and Gynecology

## 2023-10-15 ENCOUNTER — Other Ambulatory Visit: Payer: Self-pay | Admitting: Obstetrics and Gynecology

## 2023-10-15 DIAGNOSIS — R921 Mammographic calcification found on diagnostic imaging of breast: Secondary | ICD-10-CM

## 2023-11-24 ENCOUNTER — Ambulatory Visit
Admission: RE | Admit: 2023-11-24 | Discharge: 2023-11-24 | Disposition: A | Discharge status: Home / Self Care | Payer: Self-pay | Source: Ambulatory Visit | Attending: Obstetrics and Gynecology | Admitting: Obstetrics and Gynecology

## 2023-11-24 DIAGNOSIS — R921 Mammographic calcification found on diagnostic imaging of breast: Secondary | ICD-10-CM | POA: Insufficient documentation

## 2024-09-22 ENCOUNTER — Other Ambulatory Visit: Payer: Self-pay | Admitting: Obstetrics and Gynecology

## 2024-09-22 DIAGNOSIS — Z1231 Encounter for screening mammogram for malignant neoplasm of breast: Secondary | ICD-10-CM
